# Patient Record
Sex: Female | Born: 1985 | Race: White | Hispanic: No | Marital: Single | Smoking: Current every day smoker
Health system: Southern US, Community
[De-identification: ages and names within clinical notes are randomized; demographics above are authoritative.]

---
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
## 2006-01-11 ENCOUNTER — Other Ambulatory Visit: Admission: RE | Admit: 2006-01-11 | Discharge: 2006-01-11 | Payer: Self-pay | Admitting: Obstetrics and Gynecology

## 2006-05-17 ENCOUNTER — Other Ambulatory Visit: Admission: RE | Admit: 2006-05-17 | Discharge: 2006-05-17 | Payer: Self-pay | Admitting: Obstetrics and Gynecology

## 2007-05-29 ENCOUNTER — Other Ambulatory Visit: Admission: RE | Admit: 2007-05-29 | Discharge: 2007-05-29 | Payer: Self-pay | Admitting: Obstetrics and Gynecology

## 2008-01-30 ENCOUNTER — Other Ambulatory Visit: Admission: RE | Admit: 2008-01-30 | Discharge: 2008-01-30 | Payer: Self-pay | Admitting: Obstetrics and Gynecology

## 2010-08-28 ENCOUNTER — Ambulatory Visit: Payer: Self-pay | Admitting: Diagnostic Radiology

## 2010-08-28 ENCOUNTER — Emergency Department (HOSPITAL_BASED_OUTPATIENT_CLINIC_OR_DEPARTMENT_OTHER): Admission: EM | Admit: 2010-08-28 | Discharge: 2010-08-28 | Payer: Self-pay | Admitting: Emergency Medicine

## 2011-03-01 LAB — URINALYSIS, ROUTINE W REFLEX MICROSCOPIC
Bilirubin Urine: NEGATIVE
Glucose, UA: NEGATIVE mg/dL
Hgb urine dipstick: NEGATIVE
Ketones, ur: >80 mg/dL — AB
Nitrite: NEGATIVE
Protein, ur: NEGATIVE mg/dL
Specific Gravity, Urine: 1.021 (ref 1.005–1.030)
Urobilinogen, UA: 1 mg/dL (ref 0.0–1.0)
pH: 6 (ref 5.0–8.0)

## 2011-03-01 LAB — BASIC METABOLIC PANEL
Calcium: 9.1 mg/dL (ref 8.4–10.5)
Creatinine, Ser: 0.7 mg/dL (ref 0.4–1.2)
GFR calc Af Amer: >60 mL/min (ref >60)
GFR calc non Af Amer: >60 mL/min (ref >60)
Glucose, Bld: 99 mg/dL (ref 70–99)
Sodium: 140 mEq/L (ref 135–145)

## 2011-03-01 LAB — DIFFERENTIAL
Basophils Absolute: 0.1 10*3/uL (ref 0.0–0.1)
Basophils Relative: 1 % (ref 0–1)
Eosinophils Absolute: 0.1 10*3/uL (ref 0.0–0.7)
Monocytes Relative: 5 % (ref 3–12)
Neutro Abs: 4.2 10*3/uL (ref 1.7–7.7)
Neutrophils Relative %: 72 % (ref 43–77)

## 2011-03-01 LAB — HCG, QUANTITATIVE, PREGNANCY: hCG, Beta Chain, Quant, S: 69 m[IU]/mL — ABNORMAL HIGH (ref <5)

## 2011-03-01 LAB — CBC
Hemoglobin: 14.5 g/dL (ref 12.0–15.0)
MCHC: 35.1 g/dL (ref 30.0–36.0)
Platelets: 197 10*3/uL (ref 150–400)
RBC: 4.43 MIL/uL (ref 3.87–5.11)

## 2011-03-01 LAB — URINE MICROSCOPIC-ADD ON

## 2011-03-01 LAB — PREGNANCY, URINE: Preg Test, Ur: POSITIVE

## 2011-05-04 NOTE — Discharge Summary (Signed)
Lindsay Jordan, Lindsay Jordan                            ACCOUNT NO.:  [id number]   MEDICAL RECORD NO.:  [id number]                   PATIENT TYPE:  INP   LOCATION:  6707                                 FACILITY:  [facility]   PHYSICIAN:  Ola-Kunle B. Akintemi, M.D.         DATE OF BIRTH:  1986/09/30   DATE OF ADMISSION:  07/11/2002  DATE OF DISCHARGE:  07/14/2002                                 DISCHARGE SUMMARY   CHIEF COMPLAINT:  Chest pain and shortness of breath x1 day.   HISTORY OF PRESENT ILLNESS:  The patient reported intermittent fevers x3  weeks, for which she was taking Tylenol and ibuprofen at home.  The patient  presents with mother, who reported malaise for two months.  The patient  denied any acute weight loss, nausea, vomiting, diarrhea, or abdominal pain.  Also denied any URI symptoms.  The patient reported decreased p.o. intake  but was still hydrating with fluids.  The patient reported the chest pain  was sharp in nature, located in the midsternal area without any radiation.  It was reproducible to palpation and became worse with deep inspiration.  The patient reported that chest pain improved when sitting forward.  The  patient had been a previously-healthy 25 year old female.  It was noted that  [place] and had been exposed to kittens  and had notable scratch marks on the left arm.   HOSPITAL COURSE:  On admission, EKG was done in the ED showing normal sinus  rhythm.  An echocardiogram was obtained in the ED, which was normal.  The  patient was seen by [facility].  The original set of cardiac enzymes  was within normal limits.  A blood culture and CBC were drawn in the ED.  During the admission the patient remained afebrile without antipyretic  therapy.  The patient's chest pain did improve during stay, and the patient  reported no further shortness of breath during stay.  Significant lab  findings:  Original CRP drawn was 63.9 and  on recheck was 43.8.  Erythrocyte  sedimentation rate was 45.  The original white blood cell count was 8.9.  Rheumatoid factor was less than 20.  Three sets of cardiac enzymes were all  within normal limits.  ANA titer was 1:40, showing weakly positive  inhomogeneous titer.  Two echocardiograms were obtained showing no  vegetations and no signs of pericarditis.  RMSF factors were negative.  Blood culture remained negative at 48 hours.  An EKG showed normal sinus  rhythm.  During the stay the patient received a psychiatric consult to rule  out major depression.  Consult was done by [facility], and the  patient was not to be placed on any medications but agreed to have  outpatient therapy if needed.  It was noted under subjective part of history  that the patient was adopted, so we have no family history  for any  rheumatologic diseases or cardiac diseases.   FINAL DIAGNOSES:  1. Viral illness.  2. Costochondritis.   CONSULTATIONS:  1. [facility].  2. Behavioral Health from pediatric psychiatry.   DISPOSITION:  The patient is discharged home on no medications and has  follow-up date made with primary care physician, Lindsay Jordan, M.D., at  [facility], for 8/4.  The patient is discharged home.     Lindsay Jordan, M.D.                          Lindsay Jordan. Lindsay Jordan, M.D.   Lindsay Jordan  D:  07/14/2002  T:  07/18/2002  Job:

## 2011-05-04 NOTE — Consult Note (Signed)
[facility]  Patient:    Lindsay Jordan, Lindsay Jordan Visit Number: [id number] MRN: [id number]          Service Type: PED Location: [phone] Attending Physician:  Delle Reining Dictated by:   Lewayne Bunting, M.D. [facility] Proc. Date: 07/11/02 Admit Date:  07/11/2002                            Consultation Report  REFERRING PHYSICIAN:  Crista Luria, M.D.  CARDIOLOGIST: (New) Lewayne Bunting, M.D.  CURRENT COMPLAINTS:  Chest pain, dyspnea and fever.  HISTORY OF PRESENT ILLNESS:  The patient is a 25 year old Caucasian female with no prior cardiac history.  The patient reports to the emergency room today due to complaints of chest pain pleuritic in nature which started last night.  The patient stated that her chest pain clearly becomes worse on deep inspiration and feels very sharp and stabbing in nature.  She also has developed some shortness of breath associated with this particularly when she tries to take a deep breath.  Her dyspnea also is within the last 24 hours. She initially saw Dr. Marny Lowenstein this morning and the patient was then referred to the emergency room for further evaluation.  Of note, is that the patient over the last several weeks had several episodes of fever and chills.  She also reported palpitations on several occasions.  She does report that her pain is somewhat relieved with leaning forward, worse when lying down.  For the last two months, the patient has felt quite lethargic and drained of energy.  She also had a pink eye recently but did not receive any particular treatment for this.  The patient has no prior history of substernal chest pain on exertion or shortness of breath on exertion and has been able to participate in sports without any limitations.  On admission to the emergency room electrocardiogram does not show any acute infarct pattern and shows no evidence of acute pericarditis.  A bedside 2D echocardiogram was performed  with results as outlined below.  Of note was that during the echocardiographic procedure, the patient had significant chest pain in the midsternal area when moving the probe across the sternal area.  ALLERGIES:  No known drug allergies.  MEDICATIONS:  Tylenol for fever.  PAST MEDICAL HISTORY:  Pink eye last week, currently resolved.  No other significant medical problems.  SOCIAL HISTORY:  The patient lives with her parents in [place] but she is adopted.  She is a Holiday representative in [employer].  She denies tobacco, alcohol or intravenous drug use.  She does not recall any tick bites.  There has been no recent foreign travel.  She does report an ant bite earlier this summer.  FAMILY HISTORY:  The patient is adopted and therefore no family history is available.  REVIEW OF SYSTEMS:  Fever, chills and sweats as outlined above.  Chest pain, shortness of breath, dyspnea on exertion as outlined above.  No recent rash. Reports dysuria and arthralgias in the right knee as well as some left ankle edema.  No nausea or vomiting.  No polyuria or polydipsia.  PHYSICAL EXAMINATION:  VITAL SIGNS:  Blood pressure 115/66, heart rate 100 beats per minute, temperature 98.2.  GENERAL:  White female in no apparent distress but with a slapped cheek appearance.  NECK:  Supple.  HEART:  Regular rate and rhythm.  No murmur at the present time.  No rubs. No S3.  LUNGS: Clear bilaterally.  SKIN:  Warm and dry.  ABDOMEN:  Soft and nontender.  GU/RECTAL:  Deferred.  EXTREMITIES:  No edema.  NEUROLOGIC:  The patient is alert and oriented.  Grossly nonfocal.  LABORATORY DATA:  Chest x-ray clear.  No infiltrates.  Normal heart size. EKG:  Heart rate 102 beats per minute, sinus tachycardia.  Nonspecific T wave changes but not evidence of acute infarct pattern.  No definite changes consistent with pericarditis.  Hemoglobin 12.3, hematocrit 36, white count 8.9, platelet count 242.  BUN  11, creatinine 0.6,  potassium 3.5.  CK is 84, CK-MB 1.0.  IMPRESSION/PLAN: 1. Rule out pericarditis/myocarditis.  The patients chest pain is clearly    pleuritic in nature but it is clearly worsened by palpation of the mid    sternal area and therefore somewhat atypical for pericarditis; however,    there are other features that are more consistent with pericarditis.    Electrocardiogram and the echocardiogram however do not support this    diagnosis.  I would base on clinical grounds consider treatment with    nonsteroidal anti-inflammatory drugs; however, for possible viral    pericarditis.  Myocarditis also can not be ruled out although the patient    has normal left ventricular systolic function, clear diastolic    abnormality.  Again, electrocardiogram is not supportive of this    diagnosis and BMP and troponin levels are within normal limits and it is    again unlikely to represent myocarditis but this may well be a diagnosis    of exclusion.  Will obtain formal 2D echocardiographic study in the    morning. 2. Fevers.  More likely systemic inflammatory syndrome either infectious or    rheumatological in nature.  We will further defer this to the pediatric    service for workup. 3. Rule out endocarditis.  Atypical presentation for endocarditis and no    definite vegetations by 2D echocardiography to support this diagnosis;    however, would recommend blood cultures as already done.  DISPOSITION:  Agree with admission and will follow up BMP and enzymes.  I have had a long discussion with the patient and her family and explained that although we can not rule pericarditis or perimyocarditis, there is no clear evidence of an acute cardiac emergency at the present time but further observation is warranted. Dictated by:   Lewayne Bunting, M.D. [facility] Attending Physician:  Delle Reining DD:  07/11/02 TD:  07/11/02 Job: [id number]

## 2011-05-14 IMAGING — CT CT ABD-PELV W/O CM
2 of 3 series · 16 of 46 positions shown, 18 images · non-contrast
Comparison: None

CLINICAL DATA: Right-sided flank pain since last night.  Nausea.
Abortion 1 month ago.  The patient was determined to be the urine
pregnancy positive after CT was performed.

CT ABDOMEN AND PELVIS WITHOUT CONTRAST (CT UROGRAM)
TECHNIQUE: Contiguous axial images of the abdomen and pelvis
without oral or intravenous contrast were obtained.

[Series 2: renal stone < 200 lbs 5.0 b31f · axial · 0.60mm/px · z∈[+888,+1238]mm · 13 of 82 slices shown, 15 images]
[im 6/82  soft-tissue]
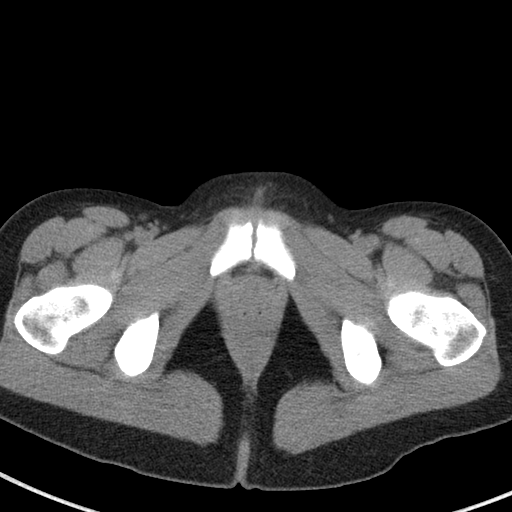
[im 6/82  bone]
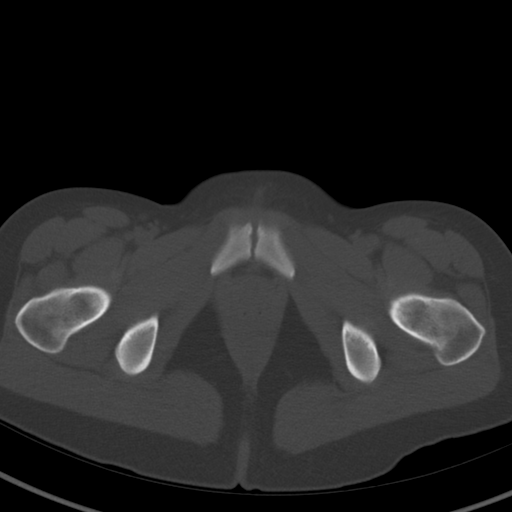
[im 11/82  soft-tissue]
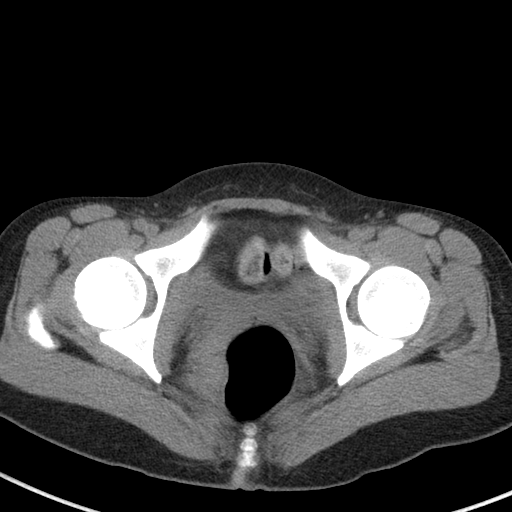
[im 16/82  soft-tissue]
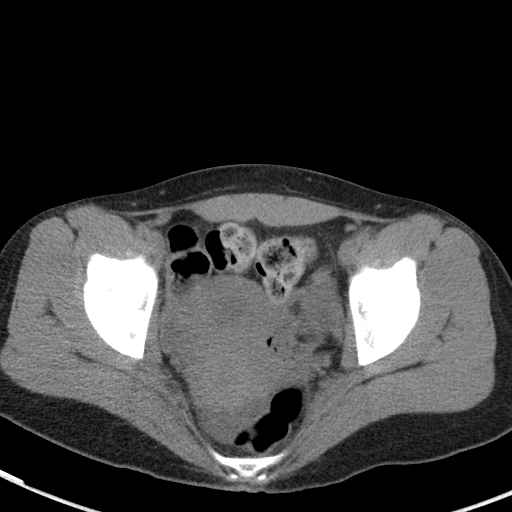
[im 24/82  soft-tissue]
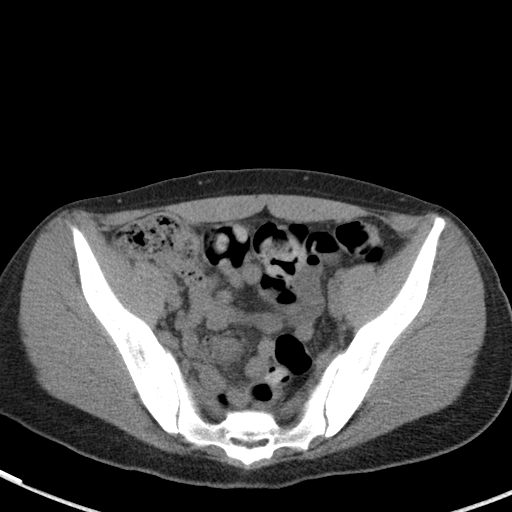
[im 29/82  soft-tissue]
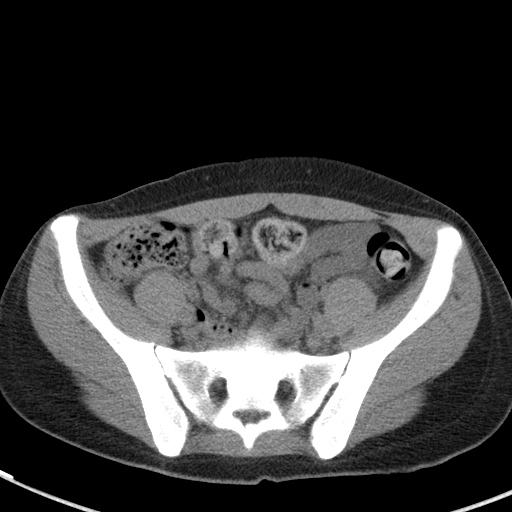
[im 34/82  soft-tissue]
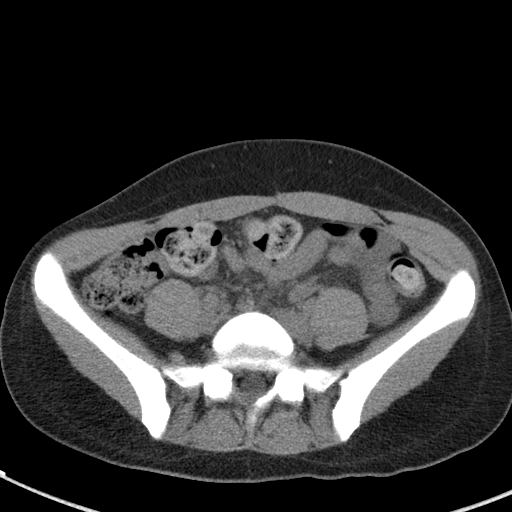
[im 42/82  soft-tissue]
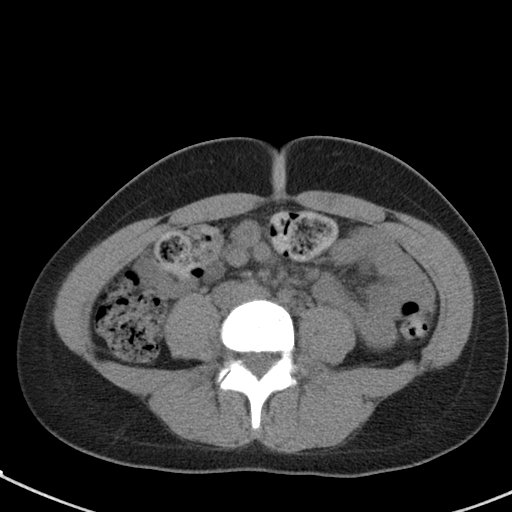
[im 48/82  soft-tissue]
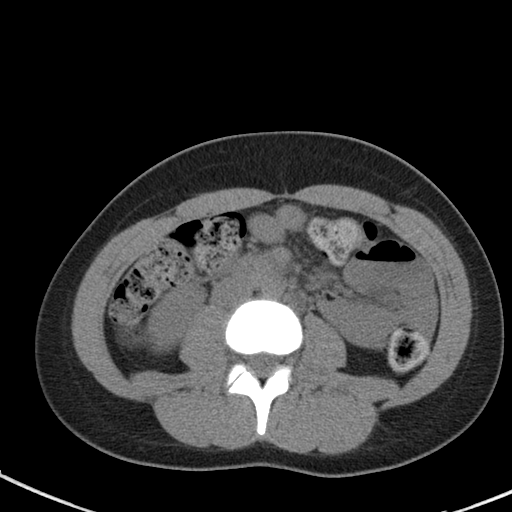
[im 53/82  soft-tissue]
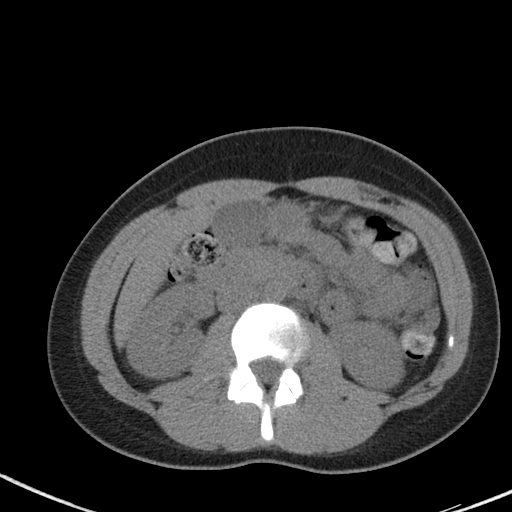
[im 53/82  bone]
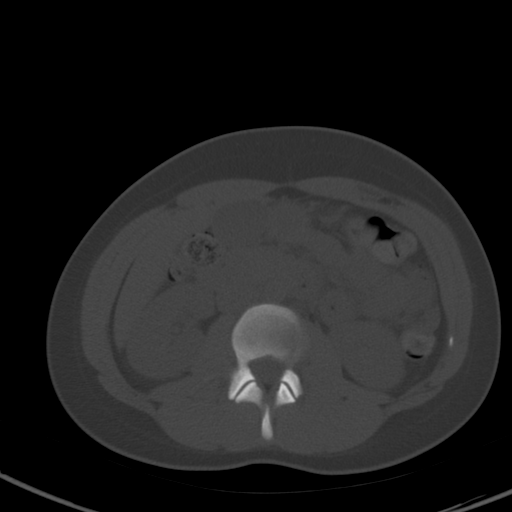
[im 58/82  soft-tissue]
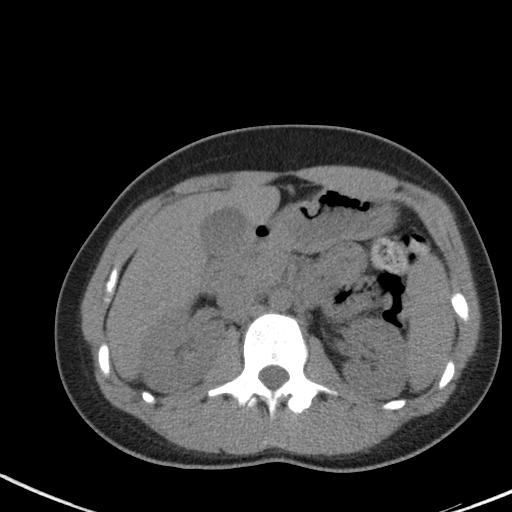
[im 66/82  soft-tissue]
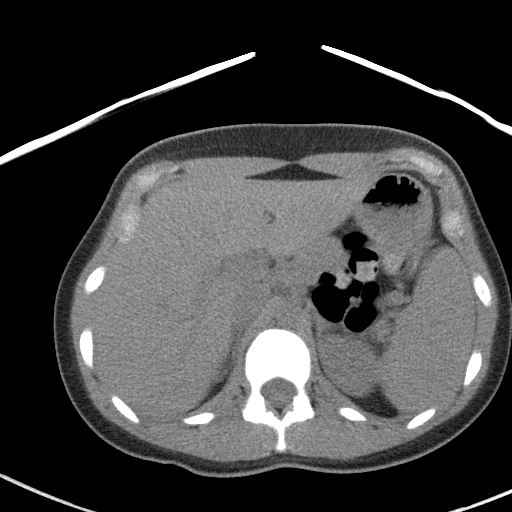
[im 71/82  soft-tissue]
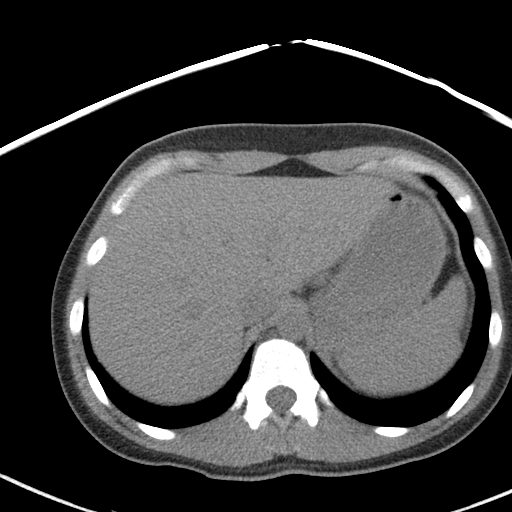
[im 76/82  soft-tissue]
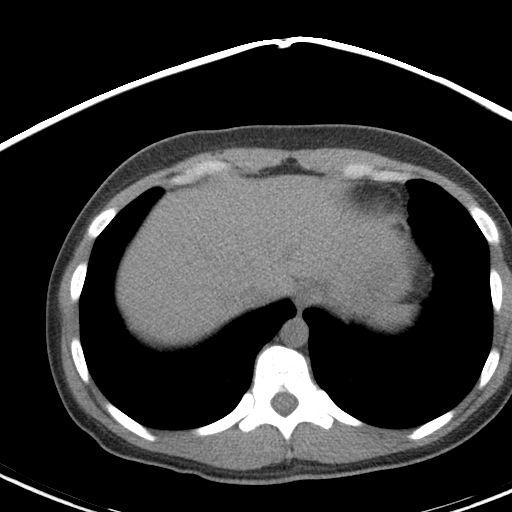

[Series 5: renal stone 3.0 coronal · coronal · 0.58mm/px · 3 of 74 slices shown]
[im 25/74  soft-tissue]
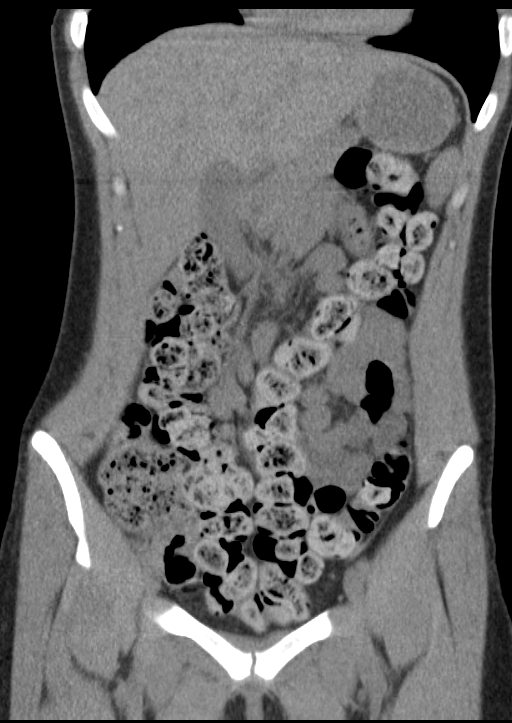
[im 33/74  soft-tissue]
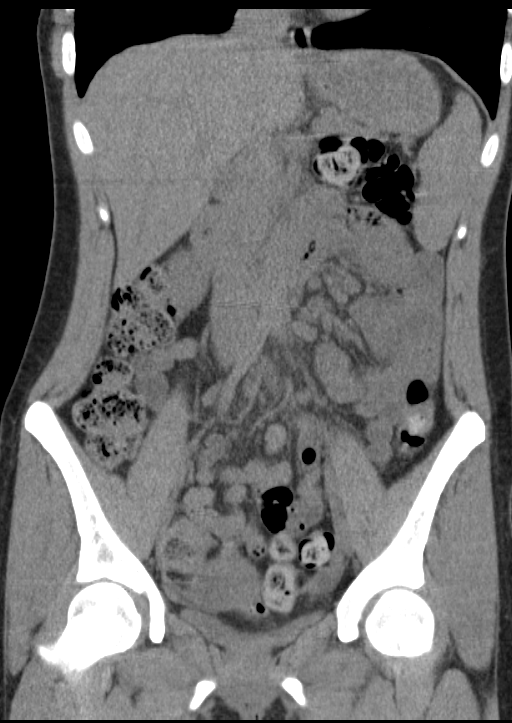
[im 41/74  soft-tissue]
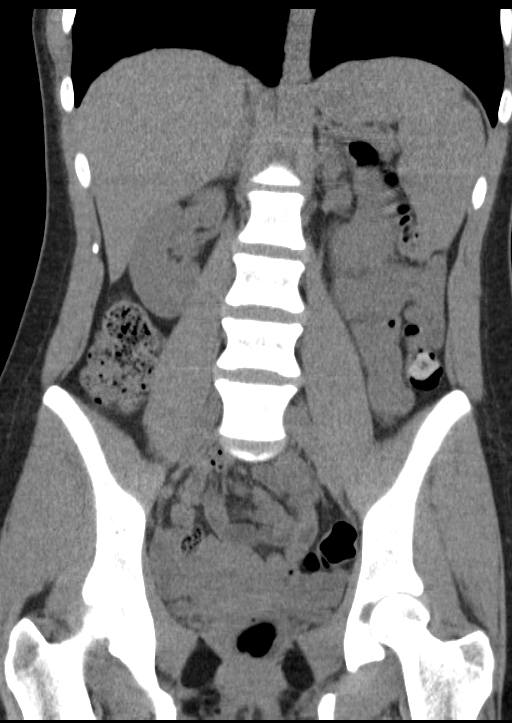

[16 of 46 positions shown; findings below may reference images not displayed]

FINDINGS: Exam is limited for evaluation of entities other than
urinary tract calculi due to lack of oral or intravenous contrast.

 Clear lung bases.  Normal heart size without pericardial or
pleural effusion.  Normal uninfused appearance of the liver,
spleen, stomach, pancreas, gallbladder, biliary tract, adrenal
glands.

No renal calculi or hydronephrosis.  No distal urinary tract
calculi or hydroureter.  No retroperitoneal or retrocrural
adenopathy.

Colonic stool burden suggests constipation.

The appendix is not definitely visualized.  No right lower quadrant
inflammation is identified.

Normal caliber small bowel loops.  No pneumatosis or free
intraperitoneal air.

No pelvic adenopathy.    Normal urinary bladder and uterus.  Small
volume free pelvic fluid is simple in density and most likely
physiologic.  No adnexal mass. No acute osseous abnormality.
IMPRESSION: 1.  No urinary tract calculi or hydronephrosis.
2.  Otherwise limited exam due to stone study technique.  The
appendix is not definitely visualized.  If this is a high clinical
concern, MRI should be considered (given the positive urine
pregnancy test).
3.  Small volume free pelvic fluid, favored to be physiologic.
4.  Probable constipation.

## 2018-04-21 DIAGNOSIS — K859 Acute pancreatitis without necrosis or infection, unspecified: Secondary | ICD-10-CM | POA: Insufficient documentation

## 2018-08-30 DIAGNOSIS — F1210 Cannabis abuse, uncomplicated: Secondary | ICD-10-CM | POA: Insufficient documentation

## 2019-06-16 DIAGNOSIS — W5911XA Bitten by nonvenomous snake, initial encounter: Secondary | ICD-10-CM | POA: Insufficient documentation

## 2020-06-08 DIAGNOSIS — M26629 Arthralgia of temporomandibular joint, unspecified side: Secondary | ICD-10-CM | POA: Insufficient documentation

## 2020-10-25 ENCOUNTER — Emergency Department
Admission: RE | Admit: 2020-10-25 | Discharge: 2020-10-25 | Disposition: A | Discharge status: Home / Self Care | Payer: BLUE CROSS/BLUE SHIELD | Source: Ambulatory Visit | Attending: Family Medicine | Admitting: Family Medicine

## 2020-10-25 ENCOUNTER — Other Ambulatory Visit: Payer: Self-pay

## 2020-10-25 VITALS — BP 124/87 | HR 90 | Temp 98.8°F | Resp 17 | Ht 65.0 in | Wt 123.0 lb

## 2020-10-25 DIAGNOSIS — J069 Acute upper respiratory infection, unspecified: Secondary | ICD-10-CM

## 2020-10-25 DIAGNOSIS — H66001 Acute suppurative otitis media without spontaneous rupture of ear drum, right ear: Secondary | ICD-10-CM

## 2020-10-25 DIAGNOSIS — J0100 Acute maxillary sinusitis, unspecified: Secondary | ICD-10-CM

## 2020-10-25 LAB — POCT MONO SCREEN (KUC): Mono, POC: NEGATIVE

## 2020-10-25 NOTE — ED Provider Notes (Signed)
Lindsay Jordan [facility]    CSN: [id number] Arrival date & time: 10/25/20  1511      History   Chief Complaint Chief Complaint  Patient presents with  . Appointment  . Cough  . Otalgia    right    HPI Lindsay Jordan is a 34 y.o. female.   One week ago patient developed sore throat and fatigue that has persisted. She next developed a non-productive cough, headache, and myalgias.  During the past two days she has had fever to 100+, sinus pain/pressure, and last night she developed right earache.  Today she has developed mild pain in her left ear.  She had a negative COVID PCR test four days ago.  The history is provided by the patient.    History reviewed. No pertinent past medical history.  Patient Active Problem List   Diagnosis Date Noted  . 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 Bite, snake 06/16/2019  . Marijuana abuse 08/30/2018  . Acute pancreatitis 04/21/2018    History reviewed. No pertinent surgical history.  OB History   No obstetric history on file.      Home Medications    Prior to Admission medications   Medication Sig Start Date End Date Taking? Authorizing Provider  ALPRAZolam Prudy Feeler) 1 MG tablet Take by mouth.   Yes [provider]  amphetamine-dextroamphetamine (ADDERALL) 10 MG tablet Take 10 mg by mouth 3 (three) times daily. 10/24/20  Yes [provider]  SODIUM FLUORIDE 5000 PPM 1.1 % PSTE Take by mouth. 10/17/20  Yes [provider]  cyclobenzaprine (FLEXERIL) 5 MG tablet Take 5 mg by mouth 3 (three) times daily as needed. 06/08/20   [provider]    Family History Family History  Adopted: Yes  Family history unknown: Yes    Social History Social History   Tobacco Use  . Smoking status: Current Every Day Smoker    Packs/day: 0.25  . Smokeless tobacco: Never Used  Substance Use Topics  . Alcohol use: Not Currently  . Drug use: Not Currently     Allergies   Latex   Review of  Systems Review of Systems + sore throat + cough No pleuritic pain No wheezing + nasal congestion + post-nasal drainage + sinus pain/pressure No itchy/red eyes + earache No hemoptysis No SOB + fever, + chills No nausea No vomiting No abdominal pain No diarrhea No urinary symptoms No skin rash + fatigue + myalgias + headache Used OTC meds (Tylenol, Mucinex) without relief   Physical Exam Triage Vital Signs ED Triage Vitals  Enc Vitals Group     BP 10/25/20 1539 124/87     Pulse Rate 10/25/20 1539 (!) 109     Resp 10/25/20 1539 17     Temp 10/25/20 1539 98.8 F (37.1 C)     Temp Source 10/25/20 1539 Oral     SpO2 10/25/20 1539 97 %     Weight 10/25/20 1544 123 lb (55.8 kg)     Height 10/25/20 1544 5\' 5"  (1.651 m)     Head Circumference --      Peak Flow --      Pain Score 10/25/20 1543 8     Pain Loc --      Pain Edu? --      Excl. in GC? --    No data found.  Updated Vital Signs BP 124/87 (BP Location: Right Arm)   Pulse (!) 109   Temp 98.8 F (37.1 C) (Oral)  Resp 17   Ht 5\' 5"  (1.651 m)   Wt 55.8 kg   LMP 10/04/2020 (Approximate)   SpO2 97%   BMI 20.47 kg/m   Visual Acuity Right Eye Distance:   Left Eye Distance:   Bilateral Distance:    Right Eye Near:   Left Eye Near:    Bilateral Near:     Physical Exam Nursing notes and Vital Signs reviewed. Appearance:  Patient appears stated age, and in no acute distress Eyes:  Pupils are equal, round, and reactive to light and accomodation.  Extraocular movement is intact.  Conjunctivae are not inflamed  Ears:  Canals normal.  Right tympanic membrane erythematous and bulging.  Left tympanic membrane has serous effusion. Nose:  Congested turbinates.  Maxillary sinus tenderness is present.  Pharynx:  Erythematous Neck:  Supple.  Mildly enlarged lateral nodes are present, tender to palpation bilaterally.  Lungs:  Clear to auscultation.  Breath sounds are equal.  Moving air well. Heart:  Regular rate  and rhythm without murmurs, rubs, or gallops.  Abdomen:  Mild tenderness over spleen without masses or hepatosplenomegaly.  Bowel sounds are present.  No CVA or flank tenderness.  Extremities:  No edema.  Skin:  No rash present.   UC Treatments / Results  Labs (all labs ordered are listed, but only abnormal results are displayed) Labs Reviewed  POCT MONO SCREEN [facility]) negative    EKG   Radiology No results found.  Procedures Procedures (including critical care time)  Medications Ordered in UC Medications - No data to display  Initial Impression / Assessment and Plan / UC Course  I have reviewed the triage vital signs and the nursing notes.  Pertinent labs & imaging results that were available during my care of the patient were reviewed by me and considered in my medical decision making (see chart for details).    Patient already has an unused prescription for amoxicillin at home. Followup with Family Doctor if not improved in 7 to 10 days.  Final Clinical Impressions(s) / UC Diagnoses   Final diagnoses:  Viral URI with cough  Right acute suppurative otitis media  Acute maxillary sinusitis, recurrence not specified     Discharge Instructions     Continue amoxicillin as prescribed for 7 to 10 days. Take plain guaifenesin (1200mg  extended release tabs such as Mucinex) twice daily, with plenty of water, for cough and congestion.  May add Pseudoephedrine (30mg , one or two every 4 to 6 hours) for sinus congestion.  Get adequate rest.   May use Afrin nasal spray (or generic oxymetazoline) each morning for about 5 days and then discontinue.  Also recommend using saline nasal spray several times daily and saline nasal irrigation (AYR is a common brand).  Use Flonase nasal spray each morning after using Afrin nasal spray and saline nasal irrigation. Try warm salt water gargles for sore throat.  Stop all antihistamines for now, and other non-prescription cough/cold  preparations. May take Tylenol as needed for pain May take Delsym Cough Suppressant ("12 Hour Cough Relief") at bedtime for nighttime cough.     ED Prescriptions    None        [facility], MD 10/29/20 1525

## 2020-10-25 NOTE — ED Triage Notes (Addendum)
General malaise x 1 week - sore throat, headache, right ear pain  OTC mucinex & Tylenol Tmax at home was 100.3 - tylenol at 1400 Negative COVID PCR this past Friday  Pfizer vaccine May 2021 Pt is in the process of being worked up for reflux/pancreatic problems

## 2020-10-25 NOTE — Discharge Instructions (Addendum)
Continue amoxicillin as prescribed for 7 to 10 days. Take plain guaifenesin (1200mg  extended release tabs such as Mucinex) twice daily, with plenty of water, for cough and congestion.  May add Pseudoephedrine (30mg , one or two every 4 to 6 hours) for sinus congestion.  Get adequate rest.   May use Afrin nasal spray (or generic oxymetazoline) each morning for about 5 days and then discontinue.  Also recommend using saline nasal spray several times daily and saline nasal irrigation (AYR is a common brand).  Use Flonase nasal spray each morning after using Afrin nasal spray and saline nasal irrigation. Try warm salt water gargles for sore throat.  Stop all antihistamines for now, and other non-prescription cough/cold preparations. May take Tylenol as needed for pain May take Delsym Cough Suppressant ("12 Hour Cough Relief") at bedtime for nighttime cough.
